# Patient Record
Sex: Female | Born: 1950 | Hispanic: No | Smoking: Former smoker
Health system: Southern US, Community
[De-identification: ages and names within clinical notes are randomized; demographics above are authoritative.]

## PROBLEM LIST (undated history)

## (undated) DIAGNOSIS — E119 Type 2 diabetes mellitus without complications: Secondary | ICD-10-CM

## (undated) DIAGNOSIS — I10 Essential (primary) hypertension: Secondary | ICD-10-CM

## (undated) DIAGNOSIS — C801 Malignant (primary) neoplasm, unspecified: Secondary | ICD-10-CM

---

## 2006-05-12 ENCOUNTER — Other Ambulatory Visit: Admission: RE | Admit: 2006-05-12 | Discharge: 2006-05-12 | Payer: Self-pay | Admitting: Family Medicine

## 2007-05-30 ENCOUNTER — Other Ambulatory Visit: Admission: RE | Admit: 2007-05-30 | Discharge: 2007-05-30 | Payer: Self-pay | Admitting: Family Medicine

## 2008-07-18 ENCOUNTER — Other Ambulatory Visit: Admission: RE | Admit: 2008-07-18 | Discharge: 2008-07-18 | Payer: Self-pay | Admitting: Family Medicine

## 2010-09-12 ENCOUNTER — Other Ambulatory Visit (HOSPITAL_COMMUNITY)
Admission: RE | Admit: 2010-09-12 | Discharge: 2010-09-12 | Disposition: A | Discharge status: Home / Self Care | Payer: Self-pay | Source: Ambulatory Visit | Attending: Family Medicine | Admitting: Family Medicine

## 2010-09-12 DIAGNOSIS — Z124 Encounter for screening for malignant neoplasm of cervix: Secondary | ICD-10-CM | POA: Insufficient documentation

## 2012-12-21 ENCOUNTER — Other Ambulatory Visit: Payer: Self-pay | Admitting: Family Medicine

## 2012-12-21 ENCOUNTER — Ambulatory Visit
Admission: RE | Admit: 2012-12-21 | Discharge: 2012-12-21 | Disposition: A | Discharge status: Home / Self Care | Payer: No Typology Code available for payment source | Source: Ambulatory Visit | Attending: Family Medicine | Admitting: Family Medicine

## 2012-12-21 DIAGNOSIS — M25559 Pain in unspecified hip: Secondary | ICD-10-CM

## 2012-12-21 DIAGNOSIS — M25529 Pain in unspecified elbow: Secondary | ICD-10-CM

## 2012-12-22 ENCOUNTER — Ambulatory Visit
Admission: RE | Admit: 2012-12-22 | Discharge: 2012-12-22 | Disposition: A | Discharge status: Home / Self Care | Payer: No Typology Code available for payment source | Source: Ambulatory Visit | Attending: Family Medicine | Admitting: Family Medicine

## 2012-12-22 ENCOUNTER — Other Ambulatory Visit: Payer: Self-pay | Admitting: Family Medicine

## 2012-12-22 DIAGNOSIS — M999 Biomechanical lesion, unspecified: Secondary | ICD-10-CM

## 2012-12-22 DIAGNOSIS — Z87891 Personal history of nicotine dependence: Secondary | ICD-10-CM

## 2013-02-24 ENCOUNTER — Emergency Department (HOSPITAL_COMMUNITY)
Admission: EM | Admit: 2013-02-24 | Discharge: 2013-02-24 | Disposition: A | Discharge status: Home / Self Care | Payer: No Typology Code available for payment source | Attending: Emergency Medicine | Admitting: Emergency Medicine

## 2013-02-24 ENCOUNTER — Emergency Department (HOSPITAL_COMMUNITY): Payer: No Typology Code available for payment source

## 2013-02-24 ENCOUNTER — Encounter (HOSPITAL_COMMUNITY): Payer: Self-pay | Admitting: Emergency Medicine

## 2013-02-24 DIAGNOSIS — C801 Malignant (primary) neoplasm, unspecified: Secondary | ICD-10-CM | POA: Insufficient documentation

## 2013-02-24 DIAGNOSIS — Z452 Encounter for adjustment and management of vascular access device: Secondary | ICD-10-CM

## 2013-02-24 DIAGNOSIS — T82598A Other mechanical complication of other cardiac and vascular devices and implants, initial encounter: Secondary | ICD-10-CM | POA: Insufficient documentation

## 2013-02-24 DIAGNOSIS — Y838 Other surgical procedures as the cause of abnormal reaction of the patient, or of later complication, without mention of misadventure at the time of the procedure: Secondary | ICD-10-CM | POA: Insufficient documentation

## 2013-02-24 HISTORY — DX: Type 2 diabetes mellitus without complications: E11.9

## 2013-02-24 HISTORY — DX: Essential (primary) hypertension: I10

## 2013-02-24 HISTORY — DX: Malignant (primary) neoplasm, unspecified: C80.1

## 2013-02-24 MED ORDER — FENTANYL CITRATE 0.05 MG/ML IJ SOLN
200.0000 ug | Freq: Once | INTRAMUSCULAR | Status: AC
  Administered 2013-02-24: 200 ug via INTRAVENOUS
  Filled 2013-02-24: qty 4

## 2013-02-24 MED ORDER — SODIUM CHLORIDE 0.9 % IV SOLN
400.0000 ug/h | INTRAVENOUS | Status: DC
  Administered 2013-02-24: 400 ug/h via INTRAVENOUS
  Filled 2013-02-24: qty 50

## 2013-02-24 MED ORDER — FENTANYL CITRATE 0.05 MG/ML IJ SOLN: 200.0000 ug | Freq: Once | INTRAMUSCULAR | Status: DC

## 2013-02-24 NOTE — ED Notes (Signed)
Pts husband took pts home medication including her fentanyl to "lock it up."

## 2013-02-24 NOTE — ED Notes (Signed)
PAM FROM [facility] HAS CALLED AND ADVISES SHE WILL BE HERE IN A FEW MINUTES. PT HUSBAND UPDATED

## 2013-02-24 NOTE — ED Notes (Signed)
[facility] at bedside. Pt resting in wheelchair. Waiting on discharge papers

## 2013-02-24 NOTE — ED Notes (Signed)
Pt reports, that when she got up to go to the bathroom tonight her PICC line got caught in her wheel chair and cut the line connecting to her pump. Pt fell going to car on buttocks, pt reports no pain. No LOC.

## 2013-02-24 NOTE — ED Provider Notes (Signed)
CSN: [id number]     Arrival date & time 02/24/13  0423 History   None    Chief Complaint  Patient presents with  . 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 She does have some pain that is recurring in her right leg, this is not as bad as the pain that she has had in the past per her explanation.  The history is provided by the patient.    No past medical history on file. No past surgical history on file. No family history on file. History  Substance Use Topics  . Smoking status: Not on file  . Smokeless tobacco: Not on file  . Alcohol Use: Not on file   OB History   No data available     Review of Systems  Constitutional: Negative for fever.  Gastrointestinal: Negative for vomiting.    Allergies  Review of patient's allergies indicates not on file.  Home Medications  No current outpatient prescriptions on file. There were no vitals taken for this visit. Physical Exam  Nursing note and vitals reviewed. Constitutional: She appears well-developed and well-nourished. No distress.  HENT:  Head: Normocephalic and atraumatic.  Mouth/Throat: Oropharynx is clear and moist. No oropharyngeal exudate.  Eyes: Conjunctivae and EOM are normal. Pupils are equal, round, and reactive to light. Right eye exhibits no discharge. Left eye exhibits no discharge. No scleral icterus.  Neck: Normal range of motion. Neck supple. No JVD present. No thyromegaly present.  Cardiovascular: Normal rate, regular rhythm, normal heart sounds and intact distal pulses.  Exam  reveals no gallop and no friction rub.   No murmur heard. Pulmonary/Chest: Effort normal and breath sounds normal. No respiratory distress. She has no wheezes. She has no rales.  Abdominal: Soft. Bowel sounds are normal. She exhibits no distension and no mass. There is no tenderness.  Musculoskeletal: Normal range of motion. She exhibits no edema and no tenderness.  PICC line left upper extremity, no redness swelling tenderness pus or fever  Lymphadenopathy:    She has no cervical adenopathy.  Neurological: She is alert. Coordination normal.  Skin: Skin is warm and dry. No rash noted. No erythema.  Psychiatric: She has a normal mood and affect. Her behavior is normal.    ED Course  Procedures (including critical care time) Labs Review Labs Reviewed - No data to display Imaging Review No results found.  EKG Interpretation   None       MDM  No diagnosis found. The patient has a PICC line in the left upper extremity which appears to be functional, there is no tenderness redness pus or swelling around the PICC site, Will give intravenous fentanyl, attempt to replace today, patient otherwise appears stable and at her baseline. She is receiving palliative care on hospice due to severity of cancer.  [facility], they will come to fix tubing, patient can be discharged once fixed.  Change of shift - care signed out to oncoming EDP - Orpah Cobb, MD 02/24/13 [phone]

## 2013-02-24 NOTE — ED Notes (Signed)
IV team paged.  

## 2013-02-24 NOTE — ED Provider Notes (Signed)
Pt rec'd at change of shift, pending Home Health RN eval to repair PICC line. Home Health RN staff have come to the ED and repaired her PICC line. Pt and her family want to go home now. Will d/c home with PMD f/u.   Laray Anger, DO 02/24/13 1125

## 2013-02-24 NOTE — ED Notes (Signed)
[facility] contacted they will be bringing new tubing for pts pump.

## 2013-02-24 NOTE — ED Notes (Signed)
[facility] has arrived.

## 2013-02-24 NOTE — ED Notes (Signed)
[facility] HAS CALLED BACK AND SHOULD BE HERE AROUND 9 AM

## 2013-03-16 DEATH — deceased

## 2013-03-20 ENCOUNTER — Telehealth: Payer: Self-pay

## 2013-03-20 NOTE — Telephone Encounter (Signed)
Patient past away @ [facility] per [facility] in [place] [employer] °

## 2015-08-23 IMAGING — CR DG HIP (WITH OR WITHOUT PELVIS) 2-3V*L*
2 series · 2 of 2 positions shown · non-contrast
Comparison: None.

CLINICAL DATA: Hip pain for 1 month, no trauma

EXAM:
LEFT HIP - COMPLETE 2+ VIEW

[t hip ap left *]
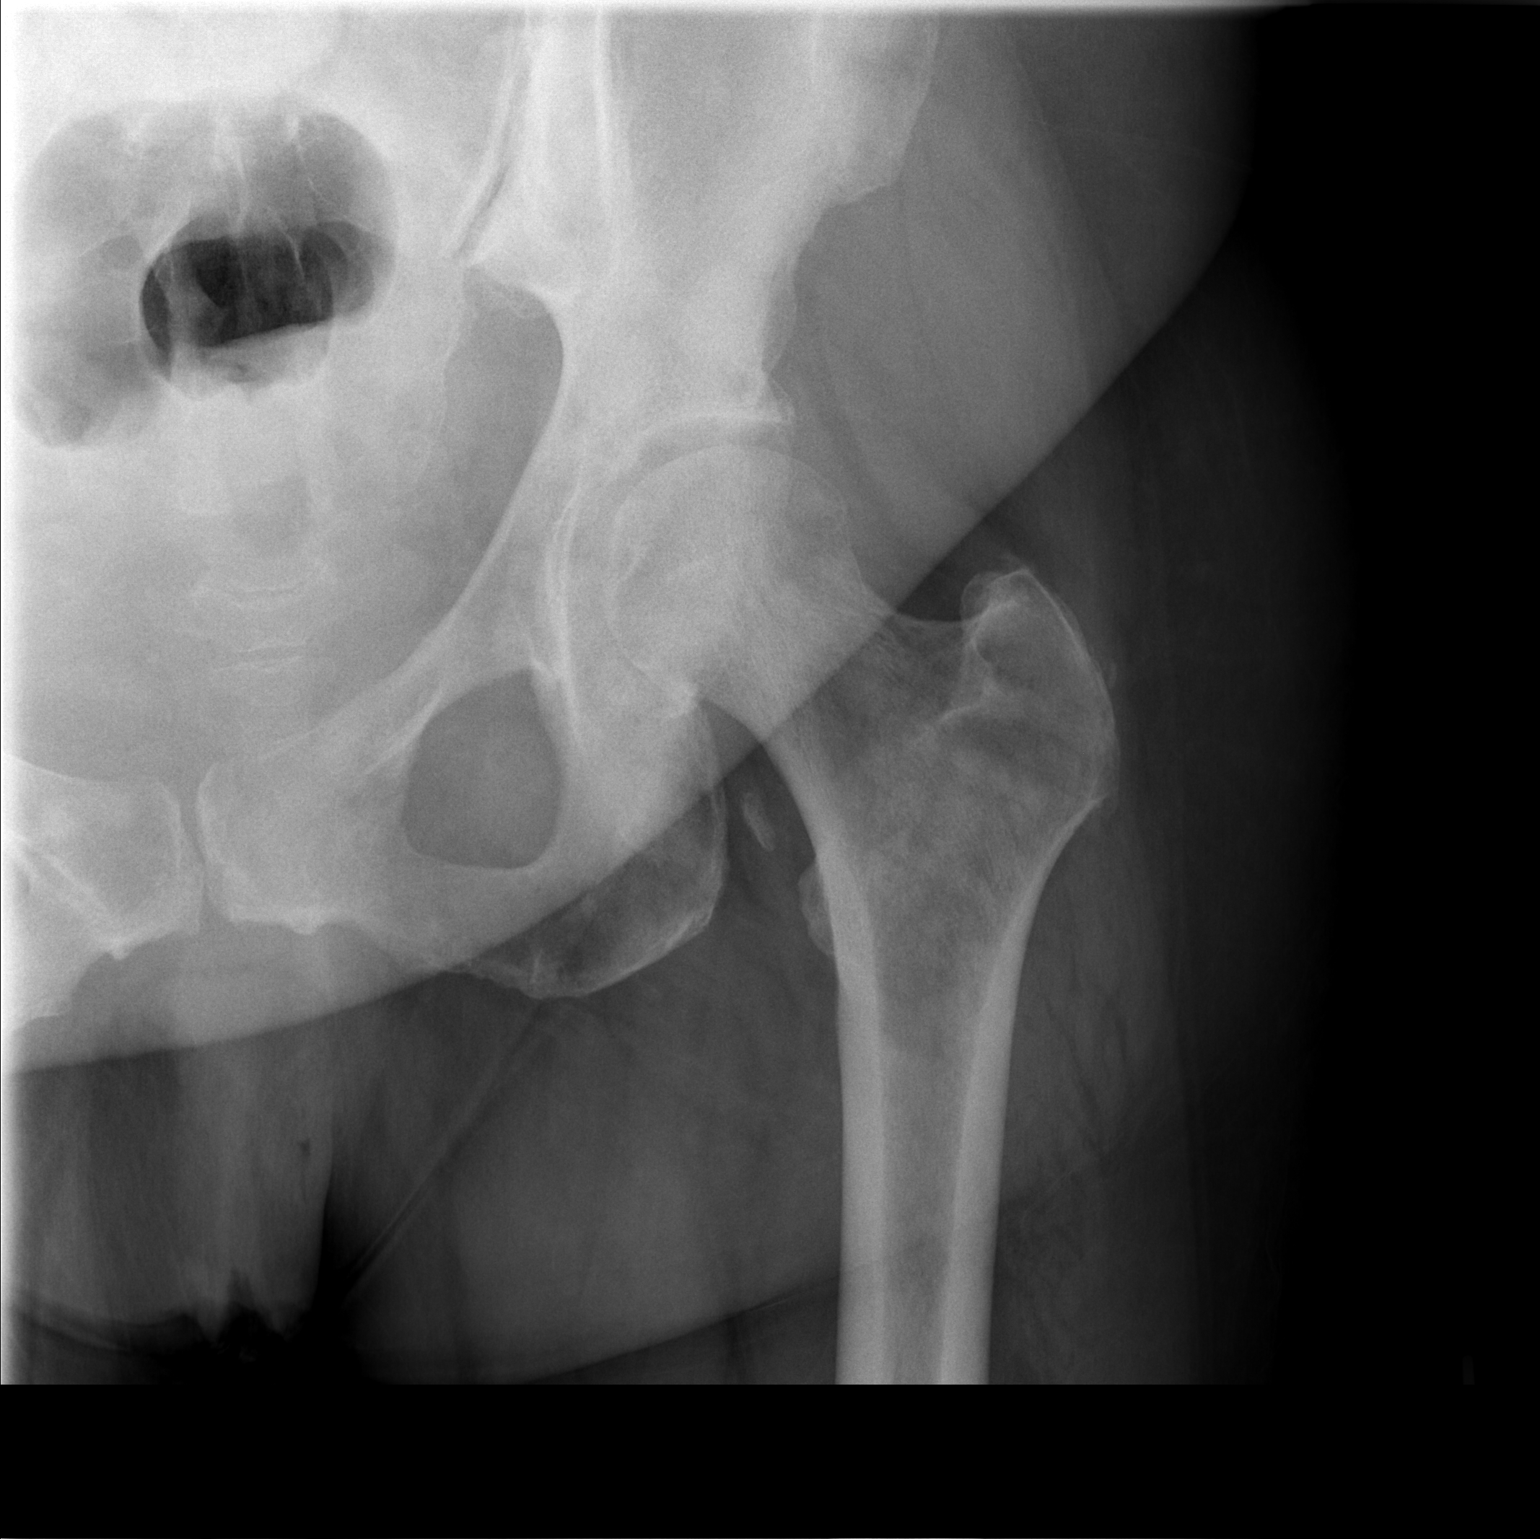

[t hip frog leg left]
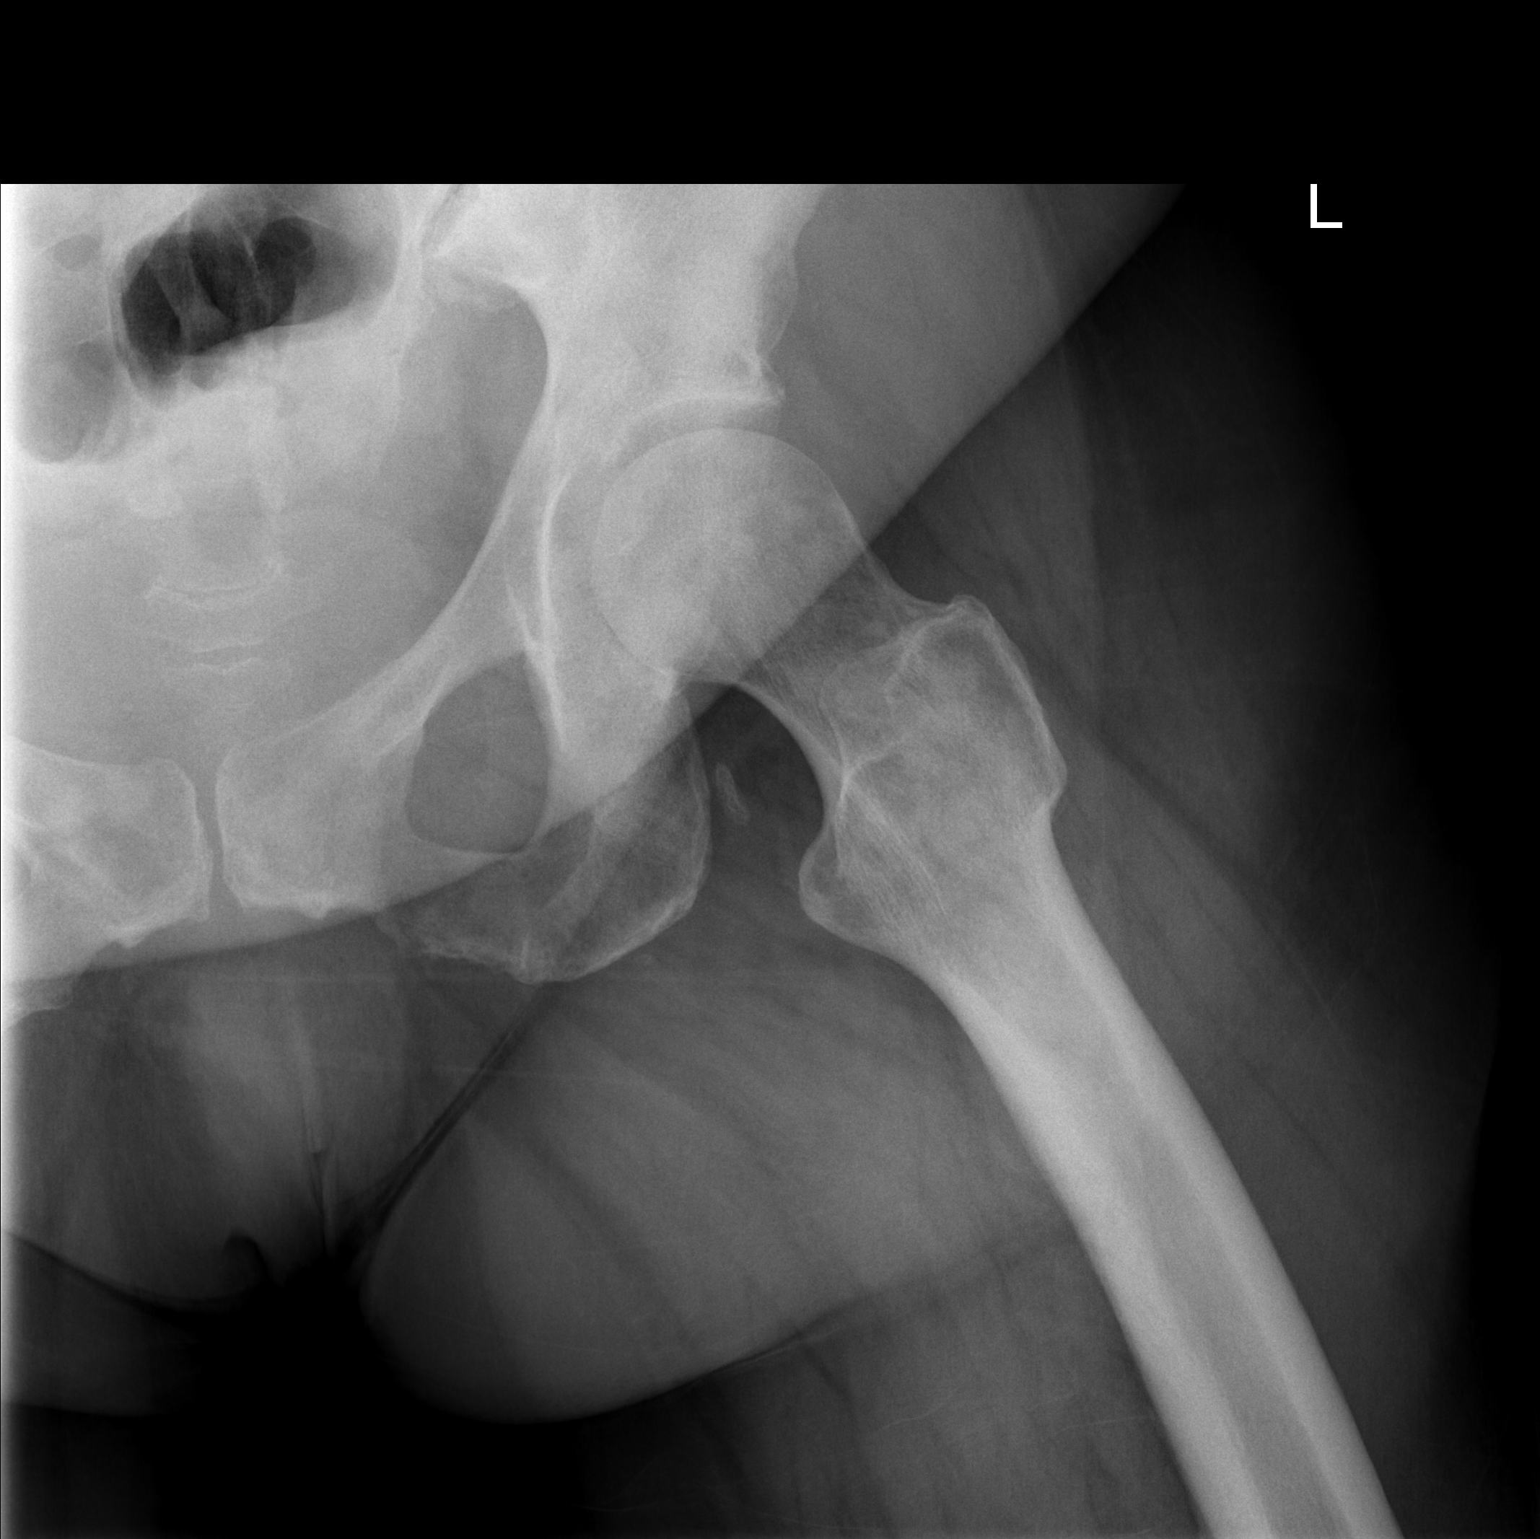

[2 of 2 positions shown; findings below may reference images not displayed]

FINDINGS: No acute abnormality of the left hip is seen. The left hip joint
space is relatively well preserved. The left ramus is intact, and
the left SI joint appears corticated. However, there do appear to be
multiple sclerotic foci scattered throughout the bones of the pelvis
and left proximal femur worrisome for sclerotic bone metastases.
Correlate clinically and consider nuclear medicine total body bone
scan if necessary.
IMPRESSION: 1. No acute abnormality. No significant degenerative change.
2. However, there do appear to be multiple sclerotic lesions
throughout the bones worrisome for sclerotic metastases. Correlate
clinically. Consider nuclear medicine total body bone scan.
